# Patient Record
Sex: Male | Born: 1995 | Race: White | Hispanic: No | Marital: Single | Smoking: Current every day smoker
Health system: Southern US, Community
[De-identification: ages and names within clinical notes are randomized; demographics above are authoritative.]

## PROBLEM LIST (undated history)

## (undated) HISTORY — PX: TONSILLECTOMY: SUR1361

---
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
## 2018-07-31 ENCOUNTER — Other Ambulatory Visit: Payer: Self-pay

## 2018-07-31 ENCOUNTER — Emergency Department
Admission: EM | Admit: 2018-07-31 | Discharge: 2018-07-31 | Disposition: A | Discharge status: Home / Self Care | Payer: Self-pay | Attending: Emergency Medicine | Admitting: Emergency Medicine

## 2018-07-31 DIAGNOSIS — Y999 Unspecified external cause status: Secondary | ICD-10-CM | POA: Insufficient documentation

## 2018-07-31 DIAGNOSIS — W890XXA Exposure to welding light (arc), initial encounter: Secondary | ICD-10-CM | POA: Insufficient documentation

## 2018-07-31 DIAGNOSIS — F17290 Nicotine dependence, other tobacco product, uncomplicated: Secondary | ICD-10-CM | POA: Insufficient documentation

## 2018-07-31 DIAGNOSIS — Y929 Unspecified place or not applicable: Secondary | ICD-10-CM | POA: Insufficient documentation

## 2018-07-31 DIAGNOSIS — Y9389 Activity, other specified: Secondary | ICD-10-CM | POA: Insufficient documentation

## 2018-07-31 DIAGNOSIS — H16133 Photokeratitis, bilateral: Secondary | ICD-10-CM | POA: Insufficient documentation

## 2018-07-31 MED ORDER — FLUORESCEIN SODIUM 1 MG OP STRP
1.0000 | ORAL_STRIP | Freq: Once | OPHTHALMIC | Status: DC
  Filled 2018-07-31: qty 1

## 2018-07-31 MED ORDER — OXYCODONE-ACETAMINOPHEN 5-325 MG PO TABS: 2.0000 | ORAL_TABLET | Freq: Four times a day (QID) | ORAL | 0 refills | Status: AC | PRN

## 2018-07-31 MED ORDER — TETRACAINE HCL 0.5 % OP SOLN
2.0000 [drp] | Freq: Once | OPHTHALMIC | Status: DC
  Filled 2018-07-31: qty 4

## 2018-07-31 MED ORDER — ERYTHROMYCIN 5 MG/GM OP OINT
TOPICAL_OINTMENT | Freq: Once | OPHTHALMIC | Status: AC
  Administered 2018-07-31: 1 via OPHTHALMIC
  Filled 2018-07-31: qty 1

## 2018-07-31 MED ORDER — OXYCODONE-ACETAMINOPHEN 5-325 MG PO TABS
2.0000 | ORAL_TABLET | Freq: Once | ORAL | Status: AC
  Administered 2018-07-31: 2 via ORAL
  Filled 2018-07-31: qty 2

## 2018-07-31 NOTE — Discharge Instructions (Addendum)
We believe you have photokeratitis of your eyes, which is essentially a sunburn of the eyeballs.  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  Please use the provided erythromycin ointment (apply a thin strip of ointment (about 1/4") to the lower eyelid of each affected eye every 6 hours until the pain resolves) and avoid driving for the next 2 days at least until your vision improves and the symptoms subside.  I strongly recommend that you go to the [facility] in the morning for further evaluation and assessment by eye specialist.  Use over-the-counter ibuprofen and Tylenol as needed according to label instructions. Take Percocet as prescribed for severe pain. Do not drink alcohol, drive or participate in any other potentially dangerous activities while taking this medication as it may make you sleepy. Do not take this medication with any other sedating medications, either prescription or over-the-counter. If you were prescribed Percocet or Vicodin, do not take these with acetaminophen (Tylenol) as it is already contained within these medications.   This medication is an opiate (or narcotic) pain medication and can be habit forming.  Use it as little as possible to achieve adequate pain control.  Do not use or use it with extreme caution if you have a history of opiate abuse or dependence.  If you are on a pain contract with your primary care doctor or a pain specialist, be sure to let them know you were prescribed this medication today from the [facility] Department.  This medication is intended for your use only - do not give any to anyone else and keep it in a secure place where nobody else, especially children, have access to it.  It will also cause or worsen constipation, so you may want to consider taking an over-the-counter stool softener while you are taking this medication.

## 2018-07-31 NOTE — ED Triage Notes (Signed)
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 Patient c/o pain, swelling, and reduced vision to bilateral eyes beginning at 20:30.

## 2018-07-31 NOTE — ED Provider Notes (Signed)
[facility] Emergency Department Provider Note  ____________________________________________   First MD Initiated Contact with Patient 07/31/18 0220     (approximate)  I have reviewed the triage vital signs and the nursing notes.   HISTORY  Chief Complaint Eye Injury    HPI Randy Wilkerson is a 23 y.o. 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  He reports that he was fine earlier today and did some welding and admits that there was an episode where he did the welding without having his mask down but he tried to look away and not directly at the light.  Gradually over the course of the evening he developed burning pain in both eyes.  He tried to irrigate his eyes thoroughly thinking that maybe he got something in them but his friends could not see anything in his eyes and it did not make the pain any better.  He feels like his vision is blurry and the pain is severe and it does not feel much better if his eyes are closed.  He has had no swelling or discharge.  Nothing in particular makes it better or worse and the burning pain is severe.  He does not remember anything hitting him in the face of the eyes.  He denies nausea, vomiting, abdominal pain, chest pain, shortness of breath, and fever.  He said he has had a similar thing happened to him in the past while welding but not this bad.  No COVID-19 symptoms.         History reviewed. No pertinent past medical history.  There are no active problems to display for this patient.   Past Surgical History:  Procedure Laterality Date   TONSILLECTOMY      Prior to Admission medications   Medication Sig Start Date End Date Taking? Authorizing Provider  oxyCODONE-acetaminophen (PERCOCET) 5-325 MG tablet Take 2 tablets by mouth every 6 (six) hours as needed for severe pain. 07/31/18   Loleta RoseForbach, Millissa Deese, MD    Allergies Patient has no known allergies.  No  family history on file.  Social History Social History   Tobacco Use   Smoking status: Current Every Day Smoker    Types: E-cigarettes   Smokeless tobacco: Never Used  Substance Use Topics   Alcohol use: Not Currently   Drug use: Not on file    Review of Systems Constitutional: No fever/chills Eyes: Blurry vision, severe pain in both eyes not relieved by closing them, burning pain, some itchy foreign body sensation. ENT: No sore throat. Respiratory: Denies shortness of breath. Musculoskeletal: Negative for neck pain.  Negative for back pain. Integumentary: Negative for rash. Neurological: Negative for headaches, focal weakness or numbness.   ____________________________________________   PHYSICAL EXAM:  VITAL SIGNS: ED Triage Vitals  Enc Vitals Group     BP 07/31/18 0158 131/80     Pulse Rate 07/31/18 0158 62     Resp 07/31/18 0158 16     Temp 07/31/18 0158 98.1 F (36.7 C)     Temp Source 07/31/18 0158 Oral     SpO2 07/31/18 0158 99 %     Weight 07/31/18 0158 63.5 kg (140 lb)     Height 07/31/18 0158 1.829 m (6')     Head Circumference --      Peak Flow --      Pain Score 07/31/18 0207 8     Pain Loc --  Pain Edu? --      Excl. in [place]? --     Constitutional: Alert and oriented.  Generally well-appearing but does appear very uncomfortable. Eyes: Conjunctivae are mildly injected bilaterally without any discharge or purulence.  Pupils seem dilated but are equally reactive to light.  After tetracaine and fluorescein, bilateral examination under Woods lamp with black light reveals no corneal abrasions or dendritic pattern, negative Seidel sign, no evidence of foreign bodies under the upper or lower lid, no evidence of globe penetration. Head: Atraumatic. Nose: No congestion/rhinnorhea. Cardiovascular: Normal rate, regular rhythm. Good peripheral circulation.  Respiratory: Normal respiratory effort.  No retractions.  Neurologic:  Normal speech and language. No  gross focal neurologic deficits are appreciated.  Skin:  Skin is warm, dry and intact. No rash noted.   ____________________________________________   LABS (all labs ordered are listed, but only abnormal results are displayed)  Labs Reviewed - No data to display ____________________________________________  EKG  No indication for EKG ____________________________________________  RADIOLOGY   ED MD interpretation: No indication for imaging  Official radiology report(s): No results found.  ____________________________________________   PROCEDURES   Procedure(s) performed (including Critical Care):  Procedures   ____________________________________________   INITIAL IMPRESSION / MDM / [place] / ED COURSE  As part of my medical decision making, I reviewed the following data within the [place] notes reviewed and incorporated, Old chart reviewed, Notes from prior ED visits and [place] Controlled Substance Database   Differential diagnosis includes, but is not limited to, photokeratitis, herpetic lesions, conjunctivitis, foreign body with corneal abrasion, globe penetration.  The patient's exam overall is reassuring with no obvious physical exam findings even after tetracaine administration, fluorescein, and Woods lamp.  The globe is soft with no evidence of acute intraocular pressure and the patient had near complete relief of his symptoms after tetracaine administration.  His symptoms occurred gradually over the hours after welding without using appropriate eye shielding.  This appears to be a straightforward case of photokeratitis.  I am providing erythromycin ophthalmic ointment for eye protection and provided my usual customary management recommendations and return precautions, including strongly encouraged him to go to the [facility] in the morning for further assessment since the weekend is coming up.  I gave him Percocet 2  tablets by mouth and a prescription for a few Percocet tablets given the painful nature of the injury.  He understands and agrees with the plan.          ____________________________________________  FINAL CLINICAL IMPRESSION(S) / ED DIAGNOSES  Final diagnoses:  Photokeratitis of both eyes     MEDICATIONS GIVEN DURING THIS VISIT:  Medications  fluorescein ophthalmic strip 1 strip (has no administration in time range)  tetracaine (PONTOCAINE) 0.5 % ophthalmic solution 2 drop (has no administration in time range)  oxyCODONE-acetaminophen (PERCOCET/ROXICET) 5-325 MG per tablet 2 tablet (2 tablets Oral Given 07/31/18 0312)  erythromycin ophthalmic ointment (1 application Both Eyes Given 07/31/18 2426)     ED Discharge Orders         Ordered    oxyCODONE-acetaminophen (PERCOCET) 5-325 MG tablet  Every 6 hours PRN     07/31/18 0314          *Please note:  Randy Wilkerson was evaluated in Emergency Department on 07/31/2018 for the symptoms described in the history of present illness. He was evaluated in the context of the global COVID-19 pandemic, which necessitated consideration that the patient might be at risk  for infection with the SARS-CoV-2 virus that causes COVID-19. Institutional protocols and algorithms that pertain to the evaluation of patients at risk for COVID-19 are in a state of rapid change based on information released by regulatory bodies including the CDC and federal and state organizations. These policies and algorithms were followed during the patient's care in the ED.  Some ED evaluations and interventions may be delayed as a result of limited staffing during the pandemic.*  Note:  This document was prepared using Dragon voice recognition software and may include unintentional dictation errors.   Loleta RoseForbach, Lilyanna Lunt, MD 07/31/18 [phone]

## 2018-07-31 NOTE — ED Notes (Addendum)
Slight swelling noted to pt's eyes; pt barely able to open eyes; eyes rolled back in head some. Strip and eye drops at bedside.
